# Patient Record
Sex: Male | Born: 1975 | Race: White | Hispanic: No | Marital: Single | Smoking: Current every day smoker
Health system: Southern US, Community
[De-identification: ages and names within clinical notes are randomized; demographics above are authoritative.]

## PROBLEM LIST (undated history)

## (undated) DIAGNOSIS — N281 Cyst of kidney, acquired: Secondary | ICD-10-CM

## (undated) DIAGNOSIS — Z8709 Personal history of other diseases of the respiratory system: Secondary | ICD-10-CM

## (undated) DIAGNOSIS — J45909 Unspecified asthma, uncomplicated: Secondary | ICD-10-CM

## (undated) DIAGNOSIS — J189 Pneumonia, unspecified organism: Secondary | ICD-10-CM

## (undated) HISTORY — PX: WISDOM TOOTH EXTRACTION: SHX21

---

## 2013-02-05 ENCOUNTER — Ambulatory Visit: Payer: Self-pay | Admitting: Emergency Medicine

## 2015-06-03 ENCOUNTER — Other Ambulatory Visit: Payer: Self-pay | Admitting: Orthopedic Surgery

## 2015-06-10 ENCOUNTER — Encounter (HOSPITAL_COMMUNITY): Payer: Self-pay

## 2015-06-10 ENCOUNTER — Encounter (HOSPITAL_COMMUNITY)
Admission: RE | Admit: 2015-06-10 | Discharge: 2015-06-10 | Disposition: A | Discharge status: Home / Self Care | Payer: BLUE CROSS/BLUE SHIELD | Source: Ambulatory Visit | Attending: Orthopedic Surgery | Admitting: Orthopedic Surgery

## 2015-06-10 DIAGNOSIS — J45909 Unspecified asthma, uncomplicated: Secondary | ICD-10-CM | POA: Insufficient documentation

## 2015-06-10 DIAGNOSIS — F17200 Nicotine dependence, unspecified, uncomplicated: Secondary | ICD-10-CM | POA: Diagnosis not present

## 2015-06-10 DIAGNOSIS — Z01818 Encounter for other preprocedural examination: Secondary | ICD-10-CM | POA: Diagnosis present

## 2015-06-10 DIAGNOSIS — Z01812 Encounter for preprocedural laboratory examination: Secondary | ICD-10-CM | POA: Insufficient documentation

## 2015-06-10 DIAGNOSIS — M5410 Radiculopathy, site unspecified: Secondary | ICD-10-CM | POA: Diagnosis not present

## 2015-06-10 HISTORY — DX: Personal history of other diseases of the respiratory system: Z87.09

## 2015-06-10 HISTORY — DX: Cyst of kidney, acquired: N28.1

## 2015-06-10 HISTORY — DX: Pneumonia, unspecified organism: J18.9

## 2015-06-10 HISTORY — DX: Unspecified asthma, uncomplicated: J45.909

## 2015-06-10 LAB — CBC WITH DIFFERENTIAL/PLATELET
Basophils Absolute: 0 10*3/uL (ref 0.0–0.1)
Basophils Relative: 0 %
EOS ABS: 0 10*3/uL (ref 0.0–0.7)
EOS PCT: 0 %
HCT: 44.6 % (ref 39.0–52.0)
Hemoglobin: 15.4 g/dL (ref 13.0–17.0)
LYMPHS ABS: 1.5 10*3/uL (ref 0.7–4.0)
Lymphocytes Relative: 20 %
MCH: 29.6 pg (ref 26.0–34.0)
MCHC: 34.5 g/dL (ref 30.0–36.0)
MCV: 85.8 fL (ref 78.0–100.0)
MONOS PCT: 11 %
Monocytes Absolute: 0.8 10*3/uL (ref 0.1–1.0)
Neutro Abs: 5.2 10*3/uL (ref 1.7–7.7)
Neutrophils Relative %: 69 %
PLATELETS: 174 10*3/uL (ref 150–400)
RBC: 5.2 MIL/uL (ref 4.22–5.81)
RDW: 13.1 % (ref 11.5–15.5)
WBC: 7.6 10*3/uL (ref 4.0–10.5)

## 2015-06-10 LAB — COMPREHENSIVE METABOLIC PANEL
ALK PHOS: 42 U/L (ref 38–126)
ALT: 29 U/L (ref 17–63)
ANION GAP: 11 (ref 5–15)
AST: 29 U/L (ref 15–41)
Albumin: 4.1 g/dL (ref 3.5–5.0)
BUN: 14 mg/dL (ref 6–20)
CALCIUM: 9.2 mg/dL (ref 8.9–10.3)
CHLORIDE: 106 mmol/L (ref 101–111)
CO2: 25 mmol/L (ref 22–32)
Creatinine, Ser: 1.09 mg/dL (ref 0.61–1.24)
GFR calc non Af Amer: >60 mL/min (ref >60)
Glucose, Bld: 84 mg/dL (ref 65–99)
Potassium: 4.2 mmol/L (ref 3.5–5.1)
SODIUM: 142 mmol/L (ref 135–145)
Total Bilirubin: 0.9 mg/dL (ref 0.3–1.2)
Total Protein: 6.5 g/dL (ref 6.5–8.1)

## 2015-06-10 LAB — URINALYSIS, ROUTINE W REFLEX MICROSCOPIC
Bilirubin Urine: NEGATIVE
Glucose, UA: NEGATIVE mg/dL
HGB URINE DIPSTICK: NEGATIVE
KETONES UR: NEGATIVE mg/dL
Leukocytes, UA: NEGATIVE
Nitrite: NEGATIVE
PROTEIN: NEGATIVE mg/dL
Specific Gravity, Urine: 1.015 (ref 1.005–1.030)
pH: 6.5 (ref 5.0–8.0)

## 2015-06-10 LAB — SURGICAL PCR SCREEN
MRSA, PCR: NEGATIVE
Staphylococcus aureus: POSITIVE — AB

## 2015-06-10 LAB — APTT: aPTT: 27 seconds (ref 24–37)

## 2015-06-10 LAB — PROTIME-INR
INR: 1 (ref 0.00–1.49)
Prothrombin Time: 13.4 seconds (ref 11.6–15.2)

## 2015-06-10 NOTE — Progress Notes (Signed)
Patient reports never seeing a cardiologist for any reason or ever having any cardiac testing done.    PCP is Dina Rich, MD

## 2015-06-10 NOTE — Pre-Procedure Instructions (Signed)
Chad Love  06/10/2015      [facility] [address] [address], [place] - [address] AT [facility] OF [address] [place] [address] Phone: [phone] Fax: [phone]    Your procedure is scheduled on Thursday February 16th 2017 at 1210 pm.  Report to [facility] Admitting at 1010 A.M.  Call this number if you have problems the morning of surgery:  [phone]   Remember:  Do not eat food or drink liquids after midnight Wednesday February 15th.  Take these medicines the morning of surgery with A SIP OF WATER hydrocodone-acetaminophen (norco/vicodin) if needed, methocarbamol (robaxin) if needed  STOP: ALL Vitamins, Supplements, Effient and Herbal Medications, Fish Oils, Aspirins, NSAIDs (Nonsteroidal Anti-inflammatories such as Ibuprofen, Aleve, or Advil), and Goody's/BC Powders 7 days prior to surgery, until after surgery as directed by your physician. (stop goody headache, ibuprofen)    Do not wear jewelry.  Do not wear lotions, powders, or perfumes.  You may wear deodorant.  Do not shave 48 hours prior to surgery.  Men may shave face and neck.  Do not bring valuables to the hospital.  [facility] is not responsible for any belongings or valuables.  Contacts, dentures or bridgework may not be worn into surgery.  Leave your suitcase in the car.  After surgery it may be brought to your room.  For patients admitted to the hospital, discharge time will be determined by your treatment team.  Patients discharged the day of surgery will not be allowed to drive home.   Please read over the following fact sheets that you were given. Pain Booklet, Coughing and Deep Breathing, MRSA Information and Surgical Site Infection Prevention        Preparing for Surgery at [facility]  Before surgery, you can play an important role.  Because skin is not sterile, your skin needs to be as free of germs as possible.  You can reduce  the number of germs on your skin by washing with CHG (chlorahexidine gluconate) Soap before surgery.  CHG is an antiseptic cleaner with kills germs and bonds with the skin to continue killing germs even after washing.   Please do not use if you have an allergy to CHG or antibacterial soaps.  If your skin becomes reddened/irritated stop using the CHG.  Do not shave (including legs and underarms) for at least 48 hours prior to first CHG shower.  It is okay to shave your face.  Please follow these instructions carefully:  1. Shower with CHG Soap the night before surgery and the morning of Surgery. 2. If you choose to wash your hair, wash your hair first as usual with your normal shampoo. 3. After you shampoo, rinse your hair and body thoroughly to remove the Shampoo. 4. Use CHG as you would any other liquid soap. You can apply chg directly to the skin and wash gently with scrungie or a clean washcloth. 5. Apply the CHG Soap to your body ONLY FROM THE NECK DOWN. Do not use on open wounds or open sores. Avoid contact with your eyes, ears, mouth and genitals (private parts). Wash genitals (private parts) with your normal soap. 6. Wash thoroughly, paying special attention to the area where your surgery will be performed. 7. Thoroughly rinse your body with warm water from the neck down. 8. DO NOT shower/wash with your normal soap after using and rinsing off the CHG Soap. 9. Pat yourself  dry with a clean towel.  10. Wear clean pajamas.  11. Place clean sheets on your bed the night of your first shower and do not sleep with pets.  Day of Surgery  Do not apply any lotions/deodorants the morning of surgery. Please wear clean clothes to the hospital/surgery center.

## 2015-06-16 NOTE — H&P (Signed)
     PREOPERATIVE H&P  Chief Complaint: L leg pain  HPI: Chad Love is a 40 y.o. male who presents with ongoing pain in the left leg  MRI reveals a left L5/S1 HNP, compression the left S1 nerve  Patient has failed multiple forms of conservative care and continues to have pain (see office notes for additional details regarding the patient's full course of treatment)  Past Medical History  Diagnosis Date  . Pneumonia   . History of bronchitis   . Asthma     as a child  . Cyst of left kidney    Past Surgical History  Procedure Laterality Date  . Wisdom tooth extraction     Social History   Social History  . Marital Status: Single    Spouse Name: N/A  . Number of Children: N/A  . Years of Education: N/A   Social History Main Topics  . Smoking status: Current Every Day Smoker -- 0.50 packs/day for 18 years  . Smokeless tobacco: Not on file  . Alcohol Use: Yes     Comment: socially  . Drug Use: No  . Sexual Activity: Not on file   Other Topics Concern  . Not on file   Social History Narrative  . No narrative on file   No family history on file. Not on File Prior to Admission medications   Medication Sig Start Date End Date Taking? Authorizing Provider  Aspirin-Acetaminophen-Caffeine (GOODY HEADACHE PO) Take 1 packet by mouth daily as needed (for headache).   Yes Historical Provider, MD  HYDROcodone-acetaminophen (NORCO/VICODIN) 5-325 MG tablet Take 0.5-1 tablets by mouth daily as needed for moderate pain.   Yes Historical Provider, MD  ibuprofen (ADVIL,MOTRIN) 200 MG tablet Take 600-800 mg by mouth 2 (two) times daily as needed for moderate pain.   Yes Historical Provider, MD  methocarbamol (ROBAXIN) 500 MG tablet Take 500 mg by mouth 2 (two) times daily as needed for muscle spasms.   Yes Historical Provider, MD  oxymetazoline (AFRIN) 0.05 % nasal spray Place 1 spray into both nostrils daily as needed for congestion.   Yes Historical Provider, MD     All  other systems have been reviewed and were otherwise negative with the exception of those mentioned in the HPI and as above.  Physical Exam: There were no vitals filed for this visit.  General: Alert, no acute distress Cardiovascular: No pedal edema Respiratory: No cyanosis, no use of accessory musculature Skin: No lesions in the area of chief complaint Neurologic: Sensation intact distally Psychiatric: Patient is competent for consent with normal mood and affect Lymphatic: No axillary or cervical lymphadenopathy  MUSCULOSKELETAL: + SLR on the left  Assessment/Plan: Left S1 Radiculopathy Plan for Procedure(s): Left sided lumbar 5-sacrum 1 microdisectomy   Emilee Hero, MD 06/16/2015 1:17 PM

## 2015-06-17 MED ORDER — CEFAZOLIN SODIUM-DEXTROSE 2-3 GM-% IV SOLR
2.0000 g | INTRAVENOUS | Status: AC
  Administered 2015-06-18: 2 g via INTRAVENOUS
  Filled 2015-06-17: qty 50

## 2015-06-17 MED ORDER — POVIDONE-IODINE 7.5 % EX SOLN
Freq: Once | CUTANEOUS | Status: DC
  Filled 2015-06-17: qty 118

## 2015-06-18 ENCOUNTER — Ambulatory Visit (HOSPITAL_COMMUNITY)
Admission: RE | Admit: 2015-06-18 | Discharge: 2015-06-18 | Disposition: A | Discharge status: Home / Self Care | Payer: BLUE CROSS/BLUE SHIELD | Source: Ambulatory Visit | Attending: Orthopedic Surgery | Admitting: Orthopedic Surgery

## 2015-06-18 ENCOUNTER — Encounter (HOSPITAL_COMMUNITY): Payer: Self-pay | Admitting: Surgery

## 2015-06-18 ENCOUNTER — Encounter (HOSPITAL_COMMUNITY)
Admission: RE | Disposition: A | Discharge status: Home / Self Care | Payer: Self-pay | Source: Ambulatory Visit | Attending: Orthopedic Surgery

## 2015-06-18 ENCOUNTER — Ambulatory Visit (HOSPITAL_COMMUNITY): Payer: BLUE CROSS/BLUE SHIELD | Admitting: Certified Registered Nurse Anesthetist

## 2015-06-18 ENCOUNTER — Ambulatory Visit (HOSPITAL_COMMUNITY): Payer: BLUE CROSS/BLUE SHIELD

## 2015-06-18 DIAGNOSIS — M5117 Intervertebral disc disorders with radiculopathy, lumbosacral region: Secondary | ICD-10-CM | POA: Insufficient documentation

## 2015-06-18 DIAGNOSIS — Z419 Encounter for procedure for purposes other than remedying health state, unspecified: Secondary | ICD-10-CM

## 2015-06-18 DIAGNOSIS — F17210 Nicotine dependence, cigarettes, uncomplicated: Secondary | ICD-10-CM | POA: Insufficient documentation

## 2015-06-18 DIAGNOSIS — J45909 Unspecified asthma, uncomplicated: Secondary | ICD-10-CM | POA: Insufficient documentation

## 2015-06-18 HISTORY — PX: LUMBAR LAMINECTOMY/DECOMPRESSION MICRODISCECTOMY: SHX5026

## 2015-06-18 SURGERY — LUMBAR LAMINECTOMY/DECOMPRESSION MICRODISCECTOMY
Anesthesia: General | Site: Spine Lumbar | Laterality: Left

## 2015-06-18 MED ORDER — ROCURONIUM BROMIDE 100 MG/10ML IV SOLN
INTRAVENOUS | Status: DC | PRN
  Administered 2015-06-18: 10 mg via INTRAVENOUS
  Administered 2015-06-18: 50 mg via INTRAVENOUS
  Administered 2015-06-18 (×2): 10 mg via INTRAVENOUS

## 2015-06-18 MED ORDER — ROCURONIUM BROMIDE 50 MG/5ML IV SOLN
INTRAVENOUS | Status: AC
  Filled 2015-06-18: qty 1

## 2015-06-18 MED ORDER — SUCCINYLCHOLINE CHLORIDE 20 MG/ML IJ SOLN
INTRAMUSCULAR | Status: AC
  Filled 2015-06-18: qty 1

## 2015-06-18 MED ORDER — LIDOCAINE HCL (CARDIAC) 20 MG/ML IV SOLN
INTRAVENOUS | Status: DC | PRN
  Administered 2015-06-18: 100 mg via INTRAVENOUS

## 2015-06-18 MED ORDER — LACTATED RINGERS IV SOLN
INTRAVENOUS | Status: DC
  Administered 2015-06-18 (×2): via INTRAVENOUS

## 2015-06-18 MED ORDER — FENTANYL CITRATE (PF) 250 MCG/5ML IJ SOLN
INTRAMUSCULAR | Status: AC
  Filled 2015-06-18: qty 5

## 2015-06-18 MED ORDER — OXYCODONE HCL 5 MG/5ML PO SOLN: 5.0000 mg | Freq: Once | ORAL | Status: AC | PRN

## 2015-06-18 MED ORDER — PROPOFOL 10 MG/ML IV BOLUS
INTRAVENOUS | Status: DC | PRN
  Administered 2015-06-18: 200 mg via INTRAVENOUS

## 2015-06-18 MED ORDER — OXYCODONE HCL 5 MG PO TABS
ORAL_TABLET | ORAL | Status: AC
  Filled 2015-06-18: qty 1

## 2015-06-18 MED ORDER — METHOCARBAMOL 1000 MG/10ML IJ SOLN
500.0000 mg | Freq: Once | INTRAVENOUS | Status: AC
  Administered 2015-06-18: 500 mg via INTRAVENOUS
  Filled 2015-06-18: qty 5

## 2015-06-18 MED ORDER — OXYCODONE HCL 5 MG PO TABS
5.0000 mg | ORAL_TABLET | Freq: Once | ORAL | Status: AC | PRN
  Administered 2015-06-18: 5 mg via ORAL

## 2015-06-18 MED ORDER — HYDROMORPHONE HCL 1 MG/ML IJ SOLN
0.2500 mg | INTRAMUSCULAR | Status: DC | PRN
  Administered 2015-06-18 (×3): 0.5 mg via INTRAVENOUS

## 2015-06-18 MED ORDER — METHYLENE BLUE 0.5 % INJ SOLN
INTRAVENOUS | Status: DC | PRN
Start: 2015-06-18 — End: 2015-06-18
  Administered 2015-06-18: 1 mL

## 2015-06-18 MED ORDER — FENTANYL CITRATE (PF) 100 MCG/2ML IJ SOLN
INTRAMUSCULAR | Status: DC | PRN
  Administered 2015-06-18: 50 ug via INTRAVENOUS
  Administered 2015-06-18: 100 ug via INTRAVENOUS
  Administered 2015-06-18 (×2): 50 ug via INTRAVENOUS

## 2015-06-18 MED ORDER — MINERAL OIL LIGHT 100 % EX OIL
TOPICAL_OIL | CUTANEOUS | Status: AC
  Filled 2015-06-18: qty 25

## 2015-06-18 MED ORDER — SUGAMMADEX SODIUM 200 MG/2ML IV SOLN
INTRAVENOUS | Status: DC | PRN
  Administered 2015-06-18: 180 mg via INTRAVENOUS

## 2015-06-18 MED ORDER — HYDROMORPHONE HCL 1 MG/ML IJ SOLN
INTRAMUSCULAR | Status: AC
  Administered 2015-06-18: 0.5 mg via INTRAVENOUS
  Filled 2015-06-18: qty 1

## 2015-06-18 MED ORDER — 0.9 % SODIUM CHLORIDE (POUR BTL) OPTIME
TOPICAL | Status: DC | PRN
  Administered 2015-06-18: 1000 mL

## 2015-06-18 MED ORDER — BUPIVACAINE-EPINEPHRINE (PF) 0.25% -1:200000 IJ SOLN
INTRAMUSCULAR | Status: AC
  Filled 2015-06-18: qty 30

## 2015-06-18 MED ORDER — LIDOCAINE HCL (CARDIAC) 20 MG/ML IV SOLN
INTRAVENOUS | Status: AC
  Filled 2015-06-18: qty 5

## 2015-06-18 MED ORDER — SODIUM CHLORIDE 0.9 % IJ SOLN
INTRAMUSCULAR | Status: AC
  Filled 2015-06-18: qty 10

## 2015-06-18 MED ORDER — MIDAZOLAM HCL 5 MG/5ML IJ SOLN
INTRAMUSCULAR | Status: DC | PRN
  Administered 2015-06-18: 2 mg via INTRAVENOUS

## 2015-06-18 MED ORDER — ONDANSETRON HCL 4 MG/2ML IJ SOLN
INTRAMUSCULAR | Status: DC | PRN
  Administered 2015-06-18: 4 mg via INTRAVENOUS

## 2015-06-18 MED ORDER — ARTIFICIAL TEARS OP OINT
TOPICAL_OINTMENT | OPHTHALMIC | Status: AC
  Filled 2015-06-18: qty 3.5

## 2015-06-18 MED ORDER — BUPIVACAINE-EPINEPHRINE 0.25% -1:200000 IJ SOLN
INTRAMUSCULAR | Status: DC | PRN
  Administered 2015-06-18: 2 mL

## 2015-06-18 MED ORDER — PROPOFOL 10 MG/ML IV BOLUS
INTRAVENOUS | Status: AC
  Filled 2015-06-18: qty 40

## 2015-06-18 MED ORDER — METHYLPREDNISOLONE ACETATE 40 MG/ML IJ SUSP
INTRAMUSCULAR | Status: AC
  Filled 2015-06-18: qty 1

## 2015-06-18 MED ORDER — ONDANSETRON HCL 4 MG/2ML IJ SOLN
INTRAMUSCULAR | Status: AC
  Filled 2015-06-18: qty 2

## 2015-06-18 MED ORDER — SUGAMMADEX SODIUM 200 MG/2ML IV SOLN
INTRAVENOUS | Status: AC
  Filled 2015-06-18: qty 2

## 2015-06-18 MED ORDER — THROMBIN 20000 UNITS EX SOLR
CUTANEOUS | Status: AC
  Filled 2015-06-18: qty 20000

## 2015-06-18 MED ORDER — ARTIFICIAL TEARS OP OINT
TOPICAL_OINTMENT | OPHTHALMIC | Status: DC | PRN
  Administered 2015-06-18: 1 via OPHTHALMIC

## 2015-06-18 MED ORDER — HEMOSTATIC AGENTS (NO CHARGE) OPTIME
TOPICAL | Status: DC | PRN
  Administered 2015-06-18 (×2): 1 via TOPICAL

## 2015-06-18 MED ORDER — THROMBIN 20000 UNITS EX SOLR
CUTANEOUS | Status: DC | PRN
  Administered 2015-06-18: 20 mL via TOPICAL

## 2015-06-18 MED ORDER — PROMETHAZINE HCL 25 MG/ML IJ SOLN: 6.2500 mg | INTRAMUSCULAR | Status: DC | PRN

## 2015-06-18 MED ORDER — EPHEDRINE SULFATE 50 MG/ML IJ SOLN
INTRAMUSCULAR | Status: AC
  Filled 2015-06-18: qty 1

## 2015-06-18 MED ORDER — LIDOCAINE HCL 4 % EX SOLN
CUTANEOUS | Status: DC | PRN
  Administered 2015-06-18: 4 mL via TOPICAL

## 2015-06-18 MED ORDER — MIDAZOLAM HCL 2 MG/2ML IJ SOLN
INTRAMUSCULAR | Status: AC
  Filled 2015-06-18: qty 2

## 2015-06-18 MED ORDER — METHYLPREDNISOLONE ACETATE 40 MG/ML IJ SUSP
INTRAMUSCULAR | Status: DC | PRN
  Administered 2015-06-18: 40 mg

## 2015-06-18 SURGICAL SUPPLY — 79 items
APL SKNCLS STERI-STRIP NONHPOA (GAUZE/BANDAGES/DRESSINGS) ×1
BENZOIN TINCTURE PRP APPL 2/3 (GAUZE/BANDAGES/DRESSINGS) ×3 IMPLANT
BUR ROUND PRECISION 4.0 (BURR) ×2 IMPLANT
BUR ROUND PRECISION 4.0MM (BURR) ×1
CANISTER SUCTION 2500CC (MISCELLANEOUS) ×3 IMPLANT
CARTRIDGE OIL MAESTRO DRILL (MISCELLANEOUS) ×1 IMPLANT
CLOSURE WOUND 1/2 X4 (GAUZE/BANDAGES/DRESSINGS) ×1
CORDS BIPOLAR (ELECTRODE) ×3 IMPLANT
COVER SURGICAL LIGHT HANDLE (MISCELLANEOUS) ×1 IMPLANT
DIFFUSER DRILL AIR PNEUMATIC (MISCELLANEOUS) ×3 IMPLANT
DRAIN CHANNEL 15F RND FF W/TCR (WOUND CARE) IMPLANT
DRAPE POUCH INSTRU U-SHP 10X18 (DRAPES) ×4 IMPLANT
DRAPE SURG 17X23 STRL (DRAPES) ×12 IMPLANT
DURAPREP 26ML APPLICATOR (WOUND CARE) ×3 IMPLANT
ELECT BLADE 4.0 EZ CLEAN MEGAD (MISCELLANEOUS) ×3
ELECT CAUTERY BLADE 6.4 (BLADE) ×3 IMPLANT
ELECT REM PT RETURN 9FT ADLT (ELECTROSURGICAL) ×3
ELECTRODE BLDE 4.0 EZ CLN MEGD (MISCELLANEOUS) ×1 IMPLANT
ELECTRODE REM PT RTRN 9FT ADLT (ELECTROSURGICAL) ×1 IMPLANT
EVACUATOR SILICONE 100CC (DRAIN) IMPLANT
FILTER STRAW FLUID ASPIR (MISCELLANEOUS) ×3 IMPLANT
GAUZE SPONGE 4X4 12PLY STRL (GAUZE/BANDAGES/DRESSINGS) ×3 IMPLANT
GAUZE SPONGE 4X4 16PLY XRAY LF (GAUZE/BANDAGES/DRESSINGS) ×6 IMPLANT
GLOVE BIO SURGEON STRL SZ7 (GLOVE) ×3 IMPLANT
GLOVE BIO SURGEON STRL SZ8 (GLOVE) ×3 IMPLANT
GLOVE BIOGEL PI IND STRL 7.0 (GLOVE) ×1 IMPLANT
GLOVE BIOGEL PI IND STRL 7.5 (GLOVE) IMPLANT
GLOVE BIOGEL PI IND STRL 8 (GLOVE) ×1 IMPLANT
GLOVE BIOGEL PI INDICATOR 7.0 (GLOVE) ×2
GLOVE BIOGEL PI INDICATOR 7.5 (GLOVE) ×2
GLOVE BIOGEL PI INDICATOR 8 (GLOVE) ×2
GLOVE ECLIPSE 7.5 STRL STRAW (GLOVE) ×4 IMPLANT
GLOVE SURG SS PI 6.5 STRL IVOR (GLOVE) ×9 IMPLANT
GOWN STRL REUS W/ TWL LRG LVL3 (GOWN DISPOSABLE) ×1 IMPLANT
GOWN STRL REUS W/ TWL XL LVL3 (GOWN DISPOSABLE) ×2 IMPLANT
GOWN STRL REUS W/TWL LRG LVL3 (GOWN DISPOSABLE) ×9
GOWN STRL REUS W/TWL XL LVL3 (GOWN DISPOSABLE) ×6
IV CATH 14GX2 1/4 (CATHETERS) ×3 IMPLANT
KIT BASIN OR (CUSTOM PROCEDURE TRAY) ×3 IMPLANT
KIT POSITION SURG JACKSON T1 (MISCELLANEOUS) ×1 IMPLANT
KIT ROOM TURNOVER OR (KITS) ×3 IMPLANT
MARKER SKIN DUAL TIP RULER LAB (MISCELLANEOUS) ×4 IMPLANT
MATRIX HEMOSTAT SURGIFLO (HEMOSTASIS) ×2 IMPLANT
NDL SPNL 18GX3.5 QUINCKE PK (NEEDLE) ×2 IMPLANT
NEEDLE 18GX1X1/2 (RX/OR ONLY) (NEEDLE) ×3 IMPLANT
NEEDLE 22X1 1/2 (OR ONLY) (NEEDLE) ×5 IMPLANT
NEEDLE HYPO 25GX1X1/2 BEV (NEEDLE) ×3 IMPLANT
NEEDLE SPNL 18GX3.5 QUINCKE PK (NEEDLE) ×9 IMPLANT
NS IRRIG 1000ML POUR BTL (IV SOLUTION) ×3 IMPLANT
OIL CARTRIDGE MAESTRO DRILL (MISCELLANEOUS) ×3
PACK LAMINECTOMY ORTHO (CUSTOM PROCEDURE TRAY) ×3 IMPLANT
PACK UNIVERSAL I (CUSTOM PROCEDURE TRAY) ×3 IMPLANT
PAD ARMBOARD 7.5X6 YLW CONV (MISCELLANEOUS) ×12 IMPLANT
PATTIES SURGICAL .5 X.5 (GAUZE/BANDAGES/DRESSINGS) IMPLANT
PATTIES SURGICAL .5 X1 (DISPOSABLE) ×3 IMPLANT
SLEEVE SURGEON STRL (DRAPES) ×2 IMPLANT
SPONGE INTESTINAL PEANUT (DISPOSABLE) ×3 IMPLANT
SPONGE SURGIFOAM ABS GEL 100 (HEMOSTASIS) ×3 IMPLANT
SPONGE SURGIFOAM ABS GEL SZ50 (HEMOSTASIS) ×3 IMPLANT
STRIP CLOSURE SKIN 1/2X4 (GAUZE/BANDAGES/DRESSINGS) ×1 IMPLANT
SURGIFLO W/THROMBIN 8M KIT (HEMOSTASIS) IMPLANT
SUT MNCRL AB 4-0 PS2 18 (SUTURE) ×3 IMPLANT
SUT VIC AB 0 CT1 18XCR BRD 8 (SUTURE) IMPLANT
SUT VIC AB 0 CT1 27 (SUTURE) ×3
SUT VIC AB 0 CT1 27XBRD ANBCTR (SUTURE) IMPLANT
SUT VIC AB 0 CT1 8-18 (SUTURE) ×3
SUT VIC AB 1 CT1 18XCR BRD 8 (SUTURE) ×1 IMPLANT
SUT VIC AB 1 CT1 8-18 (SUTURE) ×3
SUT VIC AB 2-0 CT2 18 VCP726D (SUTURE) ×3 IMPLANT
SYR 20CC LL (SYRINGE) ×3 IMPLANT
SYR BULB IRRIGATION 50ML (SYRINGE) ×3 IMPLANT
SYR CONTROL 10ML LL (SYRINGE) ×10 IMPLANT
SYR TB 1ML 26GX3/8 SAFETY (SYRINGE) ×6 IMPLANT
SYR TB 1ML LUER SLIP (SYRINGE) ×6 IMPLANT
TAPE CLOTH SURG 4X10 WHT LF (GAUZE/BANDAGES/DRESSINGS) ×3 IMPLANT
TOWEL OR 17X24 6PK STRL BLUE (TOWEL DISPOSABLE) ×3 IMPLANT
TOWEL OR 17X26 10 PK STRL BLUE (TOWEL DISPOSABLE) ×3 IMPLANT
WATER STERILE IRR 1000ML POUR (IV SOLUTION) ×3 IMPLANT
YANKAUER SUCT BULB TIP NO VENT (SUCTIONS) ×3 IMPLANT

## 2015-06-18 NOTE — Anesthesia Procedure Notes (Signed)
Procedure Name: Intubation Date/Time: 06/18/2015 11:29 AM Performed by: Orvilla Fus A Pre-anesthesia Checklist: Patient identified, Timeout performed, Emergency Drugs available, Suction available and Patient being monitored Patient Re-evaluated:Patient Re-evaluated prior to inductionOxygen Delivery Method: Circle system utilized Preoxygenation: Pre-oxygenation with 100% oxygen Intubation Type: IV induction Ventilation: Mask ventilation without difficulty Laryngoscope Size: Mac and 4 Grade View: Grade I Tube type: Oral Tube size: 8.0 mm Number of attempts: 1 Airway Equipment and Method: Stylet Placement Confirmation: ETT inserted through vocal cords under direct vision,  breath sounds checked- equal and bilateral and positive ETCO2 Secured at: 23 cm Tube secured with: Tape Dental Injury: Teeth and Oropharynx as per pre-operative assessment

## 2015-06-18 NOTE — Anesthesia Preprocedure Evaluation (Addendum)
Anesthesia Evaluation  Patient identified by MRN, date of birth, ID band Patient awake    Reviewed: Allergy & Precautions, H&P , NPO status , Patient's Chart, lab work & pertinent test results  History of Anesthesia Complications Negative for: history of anesthetic complications  Airway Mallampati: I  TM Distance: >3 FB Neck ROM: full    Dental no notable dental hx. (+) Teeth Intact, Dental Advisory Given   Pulmonary asthma , Current Smoker,    Pulmonary exam normal breath sounds clear to auscultation       Cardiovascular negative cardio ROS Normal cardiovascular exam Rhythm:regular Rate:Normal     Neuro/Psych negative neurological ROS     GI/Hepatic negative GI ROS, Neg liver ROS,   Endo/Other  negative endocrine ROS  Renal/GU Renal diseaseLeft kidney cyst     Musculoskeletal   Abdominal   Peds  Hematology negative hematology ROS (+)   Anesthesia Other Findings   Reproductive/Obstetrics negative OB ROS                            Anesthesia Physical Anesthesia Plan  ASA: II  Anesthesia Plan: General   Post-op Pain Management:    Induction: Intravenous  Airway Management Planned: Oral ETT  Additional Equipment: None  Intra-op Plan:   Post-operative Plan: Extubation in OR  Informed Consent: I have reviewed the patients History and Physical, chart, labs and discussed the procedure including the risks, benefits and alternatives for the proposed anesthesia with the patient or authorized representative who has indicated his/her understanding and acceptance.   Dental Advisory Given  Plan Discussed with: Anesthesiologist, CRNA and Surgeon  Anesthesia Plan Comments:         Anesthesia Quick Evaluation

## 2015-06-18 NOTE — Op Note (Signed)
Chad Love, Chad Love              ACCOUNT NO.:  [id number]  MEDICAL RECORD NO.:  [id number]  LOCATION:  [facility]                         FACILITY:  [facility]  PHYSICIAN:  Estill Bamberg, MD      DATE OF BIRTH:  23-Feb-1976  DATE OF PROCEDURE:  06/18/2015                              OPERATIVE REPORT   PREOPERATIVE DIAGNOSES: 1. Left-sided L1 radiculopathy. 2. 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  POSTOPERATIVE DIAGNOSES: 1. Left-sided L1 radiculopathy. 2. 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  PROCEDURE:  Left-sided L5-S1 laminotomy with partial facetectomy and removal of very large extruded left L5-S1 disk fragment.  SURGEON:  Estill Bamberg, MD.  ASSISTANTJason Coop, PA-C.  ANESTHESIA:  General endotracheal anesthesia.  COMPLICATIONS:  None.  DISPOSITION:  Stable.  ESTIMATED BLOOD LOSS:  Minimal.  INDICATIONS FOR SURGERY:  Briefly, Chad Love is a very pleasant 40- year-old male who did present to me with ongoing and severe pain in the left leg.  The patient states his pain had been present for approximately 12 years; however, the pain did recently increase.  The patient did have an epidural injection and physical therapy with temporary relief and then continued to have pain.  The MRI did reveal the findings noted above.  I did discuss treatment options with the patient.  We did discuss the specifics of the left-sided L5-S1 microdiskectomy procedure, and the patient did elect to proceed.  OPERATIVE DETAILS:  On June 18, 2015, the patient was brought to Surgery, and general endotracheal anesthesia was administered.  The patient was placed prone on an operative table with a Wilson frame. Antibiotics were given, and the back was prepped and draped.  A time-out was performed.  I then made a midline incision overlying the L5-S1 intervertebral space.  A curvilinear incision was made just to the left of the midline of  the fascia at L5-S1.  The paraspinal musculature was bluntly swept laterally.  A self-retaining McCullough retractor was placed.  A lateral intraoperative radiograph did confirm the appropriate operative level.  Using a high-speed burr, I did remove the medial and inferior aspect of the L5 lamina.  The ligamentum flavum was identified and removed at its lateral aspect.  The traversing S1 nerve was noted and medially retracted.  Immediately ventral to the nerve was noted to be a very large herniated disk fragment, migrated behind the S1 vertebral body.  I was able to tease the fragment away from the S1 vertebral body, after which point, it was removed in two large fragments in addition to additional smaller fragments.  In doing so, I was able to thoroughly and completely decompress the left S1 nerve.  I was very pleased with the decompression that I was able to accomplish.  The wound was then copiously irrigated.  All bleeding was controlled using bipolar electrocautery in addition to Surgiflo.  A 40 mg of Depo-Medrol was then introduced above the epidural space in the region of the left S1 nerve. The wound was then closed in layers using #1 Vicryl, followed by 0 Vicryl, followed by 2-0 Vicryl, followed by 3-0 Monocryl.  Benzoin and Steri-Strips were applied, followed  by sterile dressing.  All instrument counts were correct at the termination of the procedure.  Of note, Jason Coop was my assistant throughout the surgery, and did aid in retraction, suctioning, and closure from start to finish.     Estill Bamberg, MD     MD/MEDQ  D:  06/18/2015  T:  06/18/2015  Job:  [id number]

## 2015-06-18 NOTE — OR Nursing (Signed)
Dr. Yevette Edwards used 20ml total of Bupivacaine 0.25% with epi 1:100,000.DDay RN

## 2015-06-18 NOTE — Transfer of Care (Signed)
Immediate Anesthesia Transfer of Care Note  Patient: Chad Love  Procedure(s) Performed: Procedure(s) with comments: Left sided lumbar five-sacrum one microdisectomy (Left) - left  Patient Location: PACU  Anesthesia Type:General  Level of Consciousness: awake, alert  and oriented  Airway & Oxygen Therapy: Patient Spontanous Breathing and Patient connected to nasal cannula oxygen  Post-op Assessment: Report given to RN, Post -op Vital signs reviewed and stable and Patient moving all extremities  Post vital signs: Reviewed and stable  Last Vitals:  Filed Vitals:   06/18/15 0842  BP: 142/87  Pulse: 67  Temp: 37.2 C  Resp: 20    Complications: No apparent anesthesia complications

## 2015-06-22 ENCOUNTER — Encounter (HOSPITAL_COMMUNITY): Payer: Self-pay | Admitting: Orthopedic Surgery

## 2015-06-25 NOTE — Anesthesia Postprocedure Evaluation (Signed)
Anesthesia Post Note  Patient: Chad Love  Procedure(s) Performed: Procedure(s) (LRB): Left sided lumbar five-sacrum one microdisectomy (Left)  Patient location during evaluation: PACU Anesthesia Type: General Level of consciousness: awake and alert Pain management: pain level controlled Vital Signs Assessment: post-procedure vital signs reviewed and stable Respiratory status: spontaneous breathing, nonlabored ventilation, respiratory function stable and patient connected to nasal cannula oxygen Cardiovascular status: blood pressure returned to baseline and stable Postop Assessment: no signs of nausea or vomiting Anesthetic complications: no              Reino Kent

## 2017-07-24 IMAGING — CR DG CHEST 2V
2 series · 2 of 2 positions shown · non-contrast
Comparison: None.

CLINICAL DATA: Preoperative examination prior to lumbar surgery,
known no known cardiac disease; history of asthma, current smoker.

EXAM:
CHEST  2 VIEW

[w chest pa]
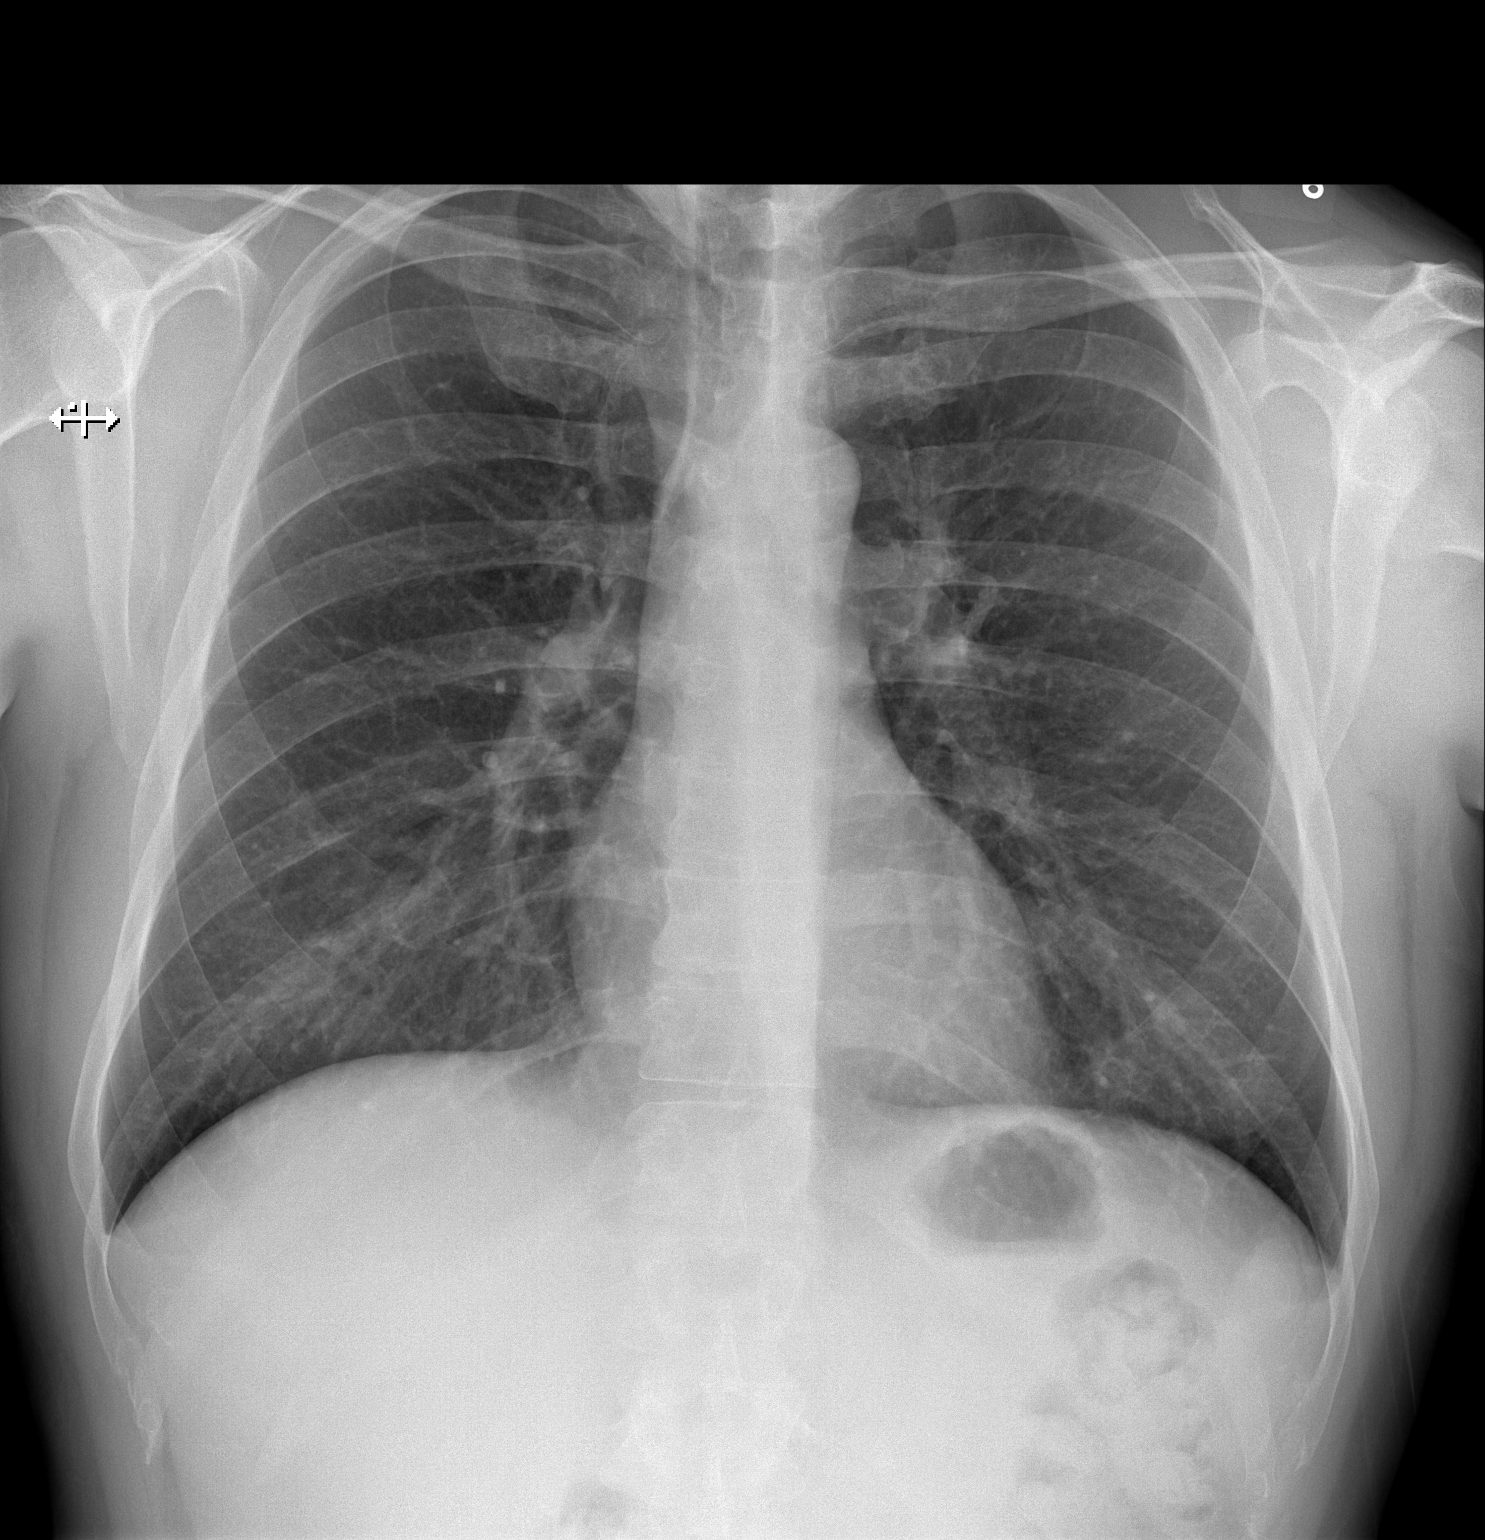

[w chest lat]
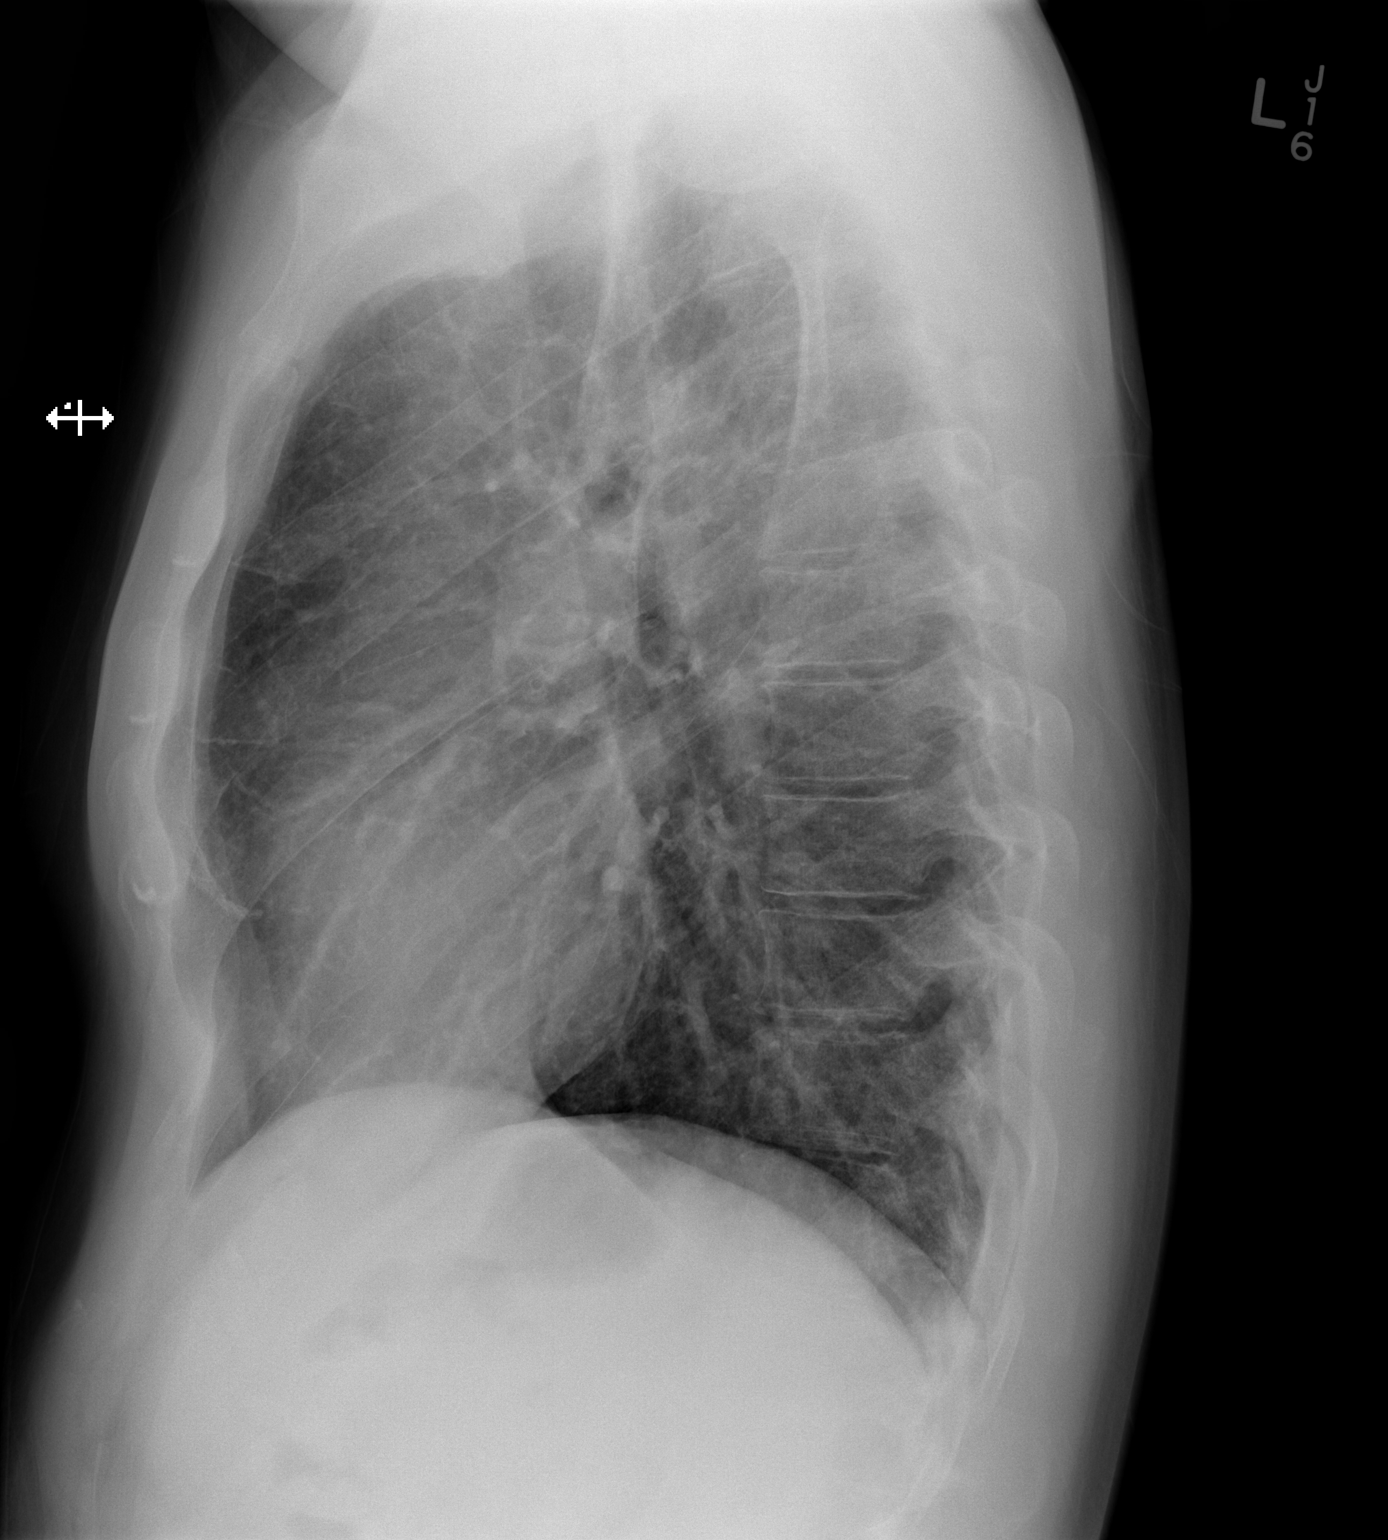

[2 of 2 positions shown; findings below may reference images not displayed]

FINDINGS: The lungs are borderline hyperinflated. There is no focal
infiltrate. There is no pleural effusion. The heart and pulmonary
vascularity are normal. The mediastinum is normal in width. The bony
thorax exhibits no acute abnormality.
IMPRESSION: Mild hyperinflation may be voluntary or may reflect the history of
reactive airway disease. There is no active cardiopulmonary disease.

## 2024-03-01 ENCOUNTER — Encounter (HOSPITAL_BASED_OUTPATIENT_CLINIC_OR_DEPARTMENT_OTHER): Payer: Self-pay

## 2024-03-01 ENCOUNTER — Ambulatory Visit (HOSPITAL_BASED_OUTPATIENT_CLINIC_OR_DEPARTMENT_OTHER)
Admission: EM | Admit: 2024-03-01 | Discharge: 2024-03-01 | Disposition: A | Discharge status: Home / Self Care | Attending: Family Medicine | Admitting: Family Medicine

## 2024-03-01 DIAGNOSIS — R079 Chest pain, unspecified: Secondary | ICD-10-CM

## 2024-03-01 DIAGNOSIS — R200 Anesthesia of skin: Secondary | ICD-10-CM | POA: Diagnosis not present

## 2024-03-01 NOTE — Discharge Instructions (Addendum)
 Facial numbness: ECG is normal.  Exam is normal.  Discussed signs and symptoms of cardiac problems.  See below for signs and symptoms of emergency cardiac concerns and reasons to call 911 or go to an emergency room.  See Dr. Ofilia (PCP) in early November, as already scheduled.  Follow-up here as needed.  Get help right away if: You have pain in your chest, neck, arm, jaw, or back, and the pain: Happens more often. Lasts more than a few minutes. Goes away and comes back. Does not get better after you take medicine under your tongue. You're dizzy or light-headed all of a sudden. You faint. You have any combination of these problems: Cold sweats. Heartburn or upset stomach. Trouble breathing. Feeling like you may throw up, or you throw up. Feeling very tired or weak. Feeling worried or nervous. These symptoms may be an emergency. Call 911 right away. Do not wait to see if the symptoms will go away. Do not drive yourself to the hospital.

## 2024-03-01 NOTE — ED Provider Notes (Addendum)
 [facility]    CSN: [id number] Arrival date & time: 03/01/24  1634      History   Chief Complaint Chief Complaint  Patient presents with   Chest Pain    HPI Chad Love is a 48 y.o. male.   48 year old male who reports he had a bone graft on his right lower jaw on Monday, 02/26/2024.  Starting Monday night he has had intermittent chest pain since the evening of 02/26/2024.  His chest pain has been intermittent and has been right chest, central chest and left chest.  It has been pain or a dull ache but not really a pressure.  It does not radiate to his arms or neck.  Today he feels flushed and has had numbness in his face.  That got concerning to him and the numbness in his face is why he came here to be evaluated.  He took 2 aspirin last night but has not taken any aspirin today.  He did take a Gas-X pill approximately 90 minutes prior to arrival.  At time of arrival he was having central and right chest pain that was mild to moderate.  And he was having some numbness of his face.  Within 10 or 15 minutes all symptoms resolved and he is pain-free at this time.  He denies fever, cough, nausea, vomiting, constipation, diarrhea.   Chest Pain Associated symptoms: numbness (Of his face)   Associated symptoms: no abdominal pain, no back pain, no cough, no fever, no nausea, no palpitations and no vomiting     Past Medical History:  Diagnosis Date   Asthma    as a child   Cyst of left kidney    History of bronchitis    Pneumonia     There are no active problems to display for this patient.   Past Surgical History:  Procedure Laterality Date   LUMBAR LAMINECTOMY/DECOMPRESSION MICRODISCECTOMY Left 06/18/2015   Procedure: Left sided lumbar five-sacrum one microdisectomy;  Surgeon: Oneil Priestly, MD;  Location: [facility];  Service: Orthopedics;  Laterality: Left;  left   WISDOM TOOTH EXTRACTION         Home Medications    Prior to Admission medications    Medication Sig Start Date End Date Taking? Authorizing Provider  oxymetazoline (AFRIN) 0.05 % nasal spray Place 1 spray into both nostrils daily as needed for congestion.    [provider]    Family History History reviewed. No pertinent family history.  Social History Social History   Tobacco Use   Smoking status: Every Day    Current packs/day: 0.50    Average packs/day: 0.5 packs/day for 18.0 years (9.0 ttl pk-yrs)    Types: Cigarettes  Substance Use Topics   Alcohol use: Yes    Comment: socially   Drug use: No     Allergies   Patient has no known allergies.   Review of Systems Review of Systems  Constitutional:  Negative for chills and fever.  HENT:  Negative for ear pain and sore throat.   Eyes:  Negative for pain and visual disturbance.  Respiratory:  Negative for cough.   Cardiovascular:  Positive for chest pain. Negative for palpitations.  Gastrointestinal:  Negative for abdominal pain, constipation, diarrhea, nausea and vomiting.  Genitourinary:  Negative for dysuria and hematuria.  Musculoskeletal:  Negative for arthralgias and back pain.  Skin:  Negative for color change and rash.  Neurological:  Positive for numbness (Of his face). Negative for seizures and  syncope.  All other systems reviewed and are negative.    Physical Exam Triage Vital Signs ED Triage Vitals [03/01/24 1659]  Encounter Vitals Group     BP 138/86     Girls Systolic BP Percentile      Girls Diastolic BP Percentile      Boys Systolic BP Percentile      Boys Diastolic BP Percentile      Pulse Rate 66     Resp 20     Temp 98 F (36.7 C)     Temp Source Oral     SpO2 97 %     Weight      Height      Head Circumference      Peak Flow      Pain Score 1     Pain Loc      Pain Education      Exclude from Growth Chart    No data found.  Updated Vital Signs BP 138/86 (BP Location: Right Arm)   Pulse 66   Temp 98 F (36.7 C) (Oral)   Resp 20   SpO2 97%    Visual Acuity Right Eye Distance:   Left Eye Distance:   Bilateral Distance:    Right Eye Near:   Left Eye Near:    Bilateral Near:     Physical Exam Vitals and nursing note reviewed.  Constitutional:      General: He is not in acute distress.    Appearance: He is well-developed. He is not ill-appearing, toxic-appearing or diaphoretic.  HENT:     Head: Normocephalic and atraumatic.     Right Ear: Hearing, tympanic membrane, ear canal and external ear normal.     Left Ear: Hearing, tympanic membrane, ear canal and external ear normal.     Nose: No congestion or rhinorrhea.     Right Sinus: No maxillary sinus tenderness or frontal sinus tenderness.     Left Sinus: No maxillary sinus tenderness or frontal sinus tenderness.     Mouth/Throat:     Lips: Pink.     Mouth: Mucous membranes are moist.     Pharynx: Uvula midline. No oropharyngeal exudate or posterior oropharyngeal erythema.     Tonsils: No tonsillar exudate.  Eyes:     Conjunctiva/sclera: Conjunctivae normal.     Pupils: Pupils are equal, round, and reactive to light.  Cardiovascular:     Rate and Rhythm: Normal rate and regular rhythm.     Heart sounds: S1 normal and S2 normal. No murmur heard. Pulmonary:     Effort: Pulmonary effort is normal. No respiratory distress.     Breath sounds: Normal breath sounds. No decreased breath sounds, wheezing, rhonchi or rales.  Abdominal:     General: Bowel sounds are normal.     Palpations: Abdomen is soft.     Tenderness: There is no abdominal tenderness.  Musculoskeletal:        General: No swelling.     Cervical back: Neck supple.  Lymphadenopathy:     Head:     Right side of head: No submental, submandibular, tonsillar, preauricular or posterior auricular adenopathy.     Left side of head: No submental, submandibular, tonsillar, preauricular or posterior auricular adenopathy.     Cervical: No cervical adenopathy.     Right cervical: No superficial cervical  adenopathy.    Left cervical: No superficial cervical adenopathy.  Skin:    General: Skin is warm and dry.     Capillary  Refill: Capillary refill takes less than 2 seconds.     Findings: No rash.  Neurological:     Mental Status: He is alert and oriented to person, place, and time.     Cranial Nerves: Cranial nerves 2-12 are intact.     Sensory: Sensation is intact.     Motor: Motor function is intact.     Coordination: Coordination is intact.     Gait: Gait is intact.  Psychiatric:        Mood and Affect: Mood normal.      UC Treatments / Results  Labs (all labs ordered are listed, but only abnormal results are displayed) Labs Reviewed - No data to display  EKG   Radiology No results found.  Procedures ED EKG  Date/Time: 03/01/2024 5:03 PM  Performed by: Ival Domino, FNP Authorized by: Ival Domino, FNP   ECG interpreted by ED Physician in the absence of a cardiologist: yes MARYLN. Emin Foree, FNP-BC)   Previous ECG:    Previous ECG:  Unavailable Interpretation:    Interpretation: normal     Details:  NSR with Left Axis Deviation, but no ectopy Rate:    ECG rate assessment: normal   Rhythm:    Rhythm: sinus rhythm   Ectopy:    Ectopy: none   QRS:    QRS axis:  Left   QRS intervals:  Normal   QRS conduction: normal   ST segments:    ST segments:  Normal T waves:    T waves: normal   Q waves:    Abnormal Q-waves: not present    (including critical care time)  Medications Ordered in UC Medications - No data to display  Initial Impression / Assessment and Plan / UC Course  I have reviewed the triage vital signs and the nursing notes.  Pertinent labs & imaging results that were available during my care of the patient were reviewed by me and considered in my medical decision making (see chart for details).  Plan of Care: Facial numbness: ECG is normal.  Exam is normal.  Discussed signs and symptoms of cardiac problems.  See below for signs and symptoms of  emergency cardiac concerns and reasons to call 911 or go to an emergency room.  See discharge instructions for additional instructions including information on reasons to call 911 or go to an emergency room.  Discussed use of Pepcid AC if he has any chest pain again.  This could help determine if this is cardiac or gastric pain.  He has taken some aspirin on 02/29/2024 but did not advise about daily aspirin as it could cause any gastric problems to be worse and he is also having additional dental work and may not need to be on aspirin when he is having these dental procedures.  See Dr. Ofilia (PCP) in early November, as already scheduled.  Follow-up here as needed.  I reviewed the plan of care with the patient and/or the patient's guardian.  The patient and/or guardian had time to ask questions and acknowledged that the questions were answered.  I provided instruction on symptoms or reasons to return here or to go to an ER, if symptoms/condition did not improve, worsened or if new symptoms occurred.  Final Clinical Impressions(s) / UC Diagnoses   Final diagnoses:  Chest pain, unspecified type  Facial numbness     Discharge Instructions      Facial numbness: ECG is normal.  Exam is normal.  Discussed signs and symptoms of cardiac  problems.  See below for signs and symptoms of emergency cardiac concerns and reasons to call 911 or go to an emergency room.  See Dr. Ofilia (PCP) in early November, as already scheduled.  Follow-up here as needed.  Get help right away if: You have pain in your chest, neck, arm, jaw, or back, and the pain: Happens more often. Lasts more than a few minutes. Goes away and comes back. Does not get better after you take medicine under your tongue. You're dizzy or light-headed all of a sudden. You faint. You have any combination of these problems: Cold sweats. Heartburn or upset stomach. Trouble breathing. Feeling like you may throw up, or you throw up. Feeling  very tired or weak. Feeling worried or nervous. These symptoms may be an emergency. Call 911 right away. Do not wait to see if the symptoms will go away. Do not drive yourself to the hospital.     ED Prescriptions   None    PDMP not reviewed this encounter.   Ival Domino, FNP 03/01/24 1724    Ival Domino, FNP 03/01/24 [phone]

## 2024-03-01 NOTE — ED Triage Notes (Signed)
 Recent dental work. States intermittent chest pain since Tuesday with facial/head numbness. Presents today feeling active midsternal chest pain, dizziness. Pain does not radiate into neck or down arms. Patient states feels flushed. No nausea.  Took 2 aspirin last night.
